# Patient Record
Sex: Male | Born: 1990 | Race: White | Hispanic: No
Health system: Southern US, Community
[De-identification: ages and names within clinical notes are randomized; demographics above are authoritative.]

---

## 2014-02-19 ENCOUNTER — Other Ambulatory Visit: Payer: Self-pay | Admitting: Neurological Surgery

## 2014-02-19 ENCOUNTER — Other Ambulatory Visit (HOSPITAL_BASED_OUTPATIENT_CLINIC_OR_DEPARTMENT_OTHER): Payer: Self-pay | Admitting: Neurological Surgery

## 2014-02-19 DIAGNOSIS — M542 Cervicalgia: Secondary | ICD-10-CM

## 2014-03-05 ENCOUNTER — Other Ambulatory Visit (HOSPITAL_BASED_OUTPATIENT_CLINIC_OR_DEPARTMENT_OTHER): Payer: Self-pay | Admitting: Neurological Surgery

## 2014-03-05 DIAGNOSIS — Z1389 Encounter for screening for other disorder: Secondary | ICD-10-CM

## 2014-03-06 ENCOUNTER — Ambulatory Visit (INDEPENDENT_AMBULATORY_CARE_PROVIDER_SITE_OTHER): Payer: Medicaid Other

## 2014-03-06 DIAGNOSIS — M5020 Other cervical disc displacement, unspecified cervical region: Secondary | ICD-10-CM

## 2014-03-06 DIAGNOSIS — Z1389 Encounter for screening for other disorder: Secondary | ICD-10-CM

## 2014-03-06 DIAGNOSIS — M542 Cervicalgia: Secondary | ICD-10-CM

## 2014-03-06 IMAGING — CR DG ORBITS FOR FOREIGN BODY
2 series · 2 of 2 positions shown · non-contrast
Comparison: None.

CLINICAL DATA: Metal working/exposure; clearance prior to MRI

EXAM:
ORBITS FOR FOREIGN BODY - 2 VIEW

[view not recorded (1 of 2)]
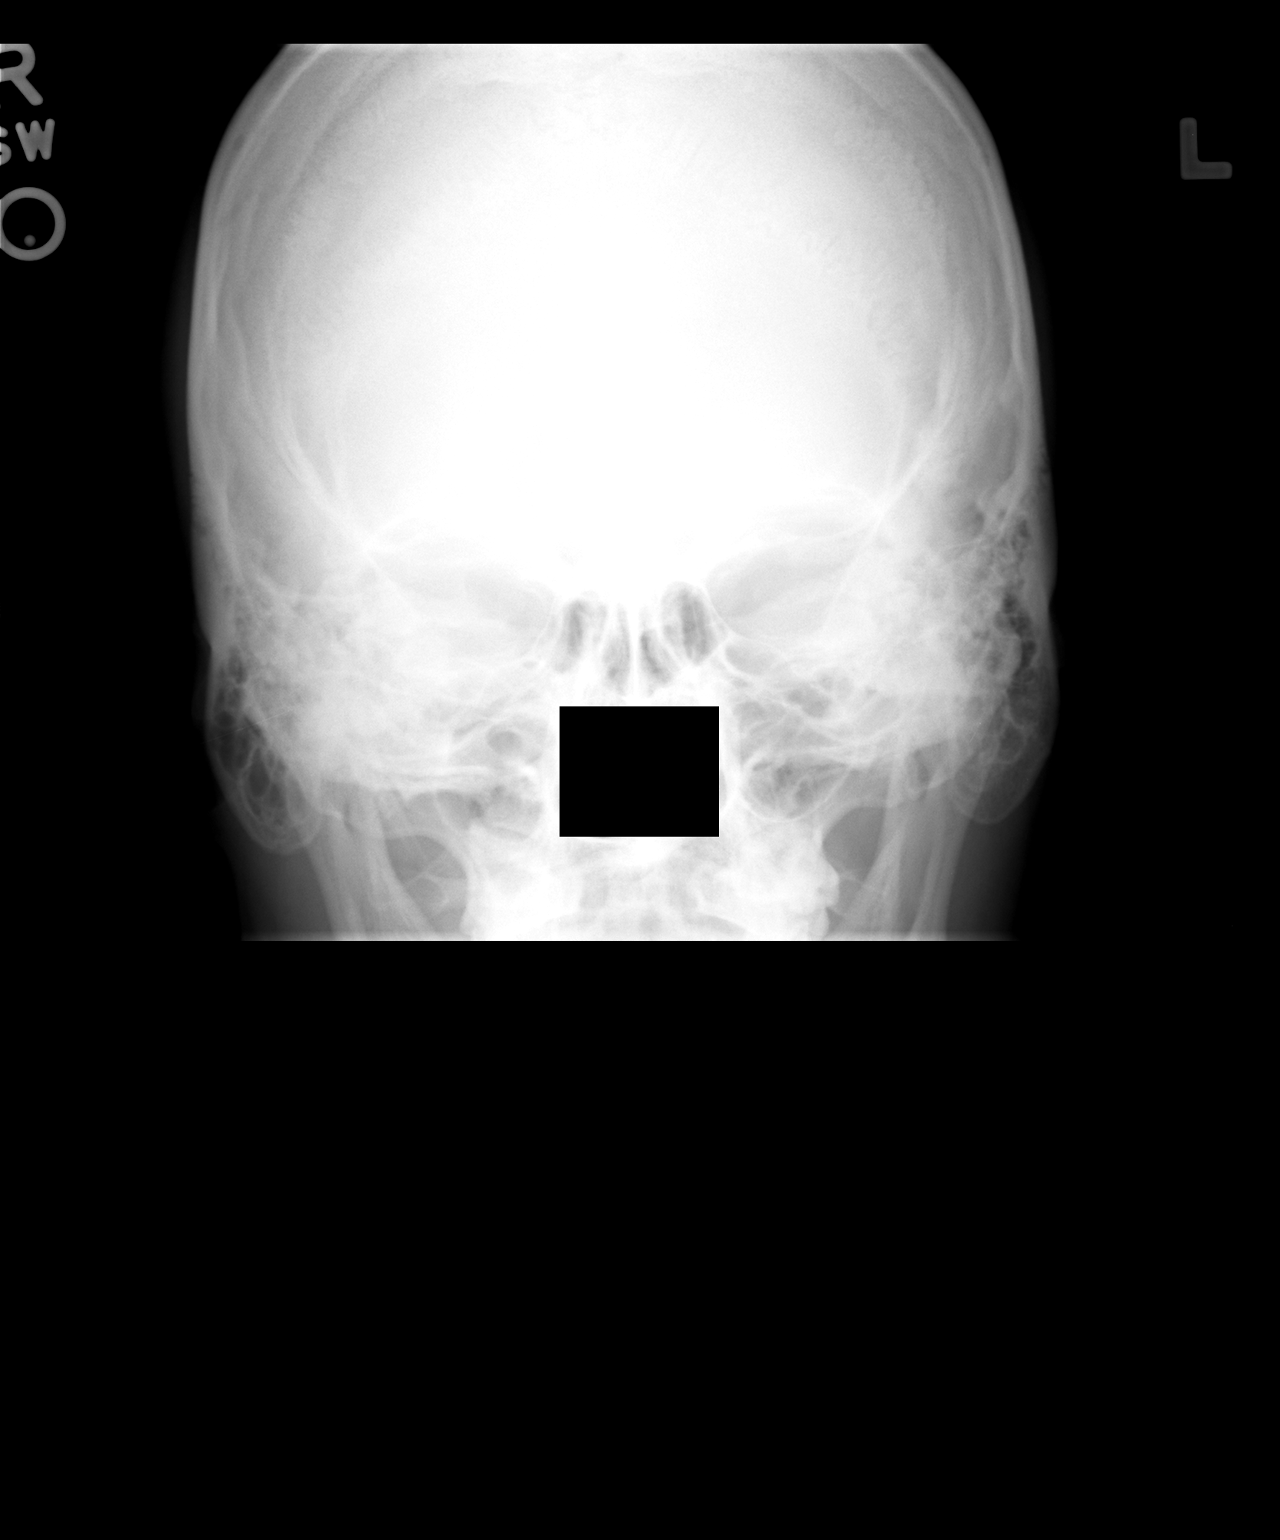

[view not recorded (2 of 2)]
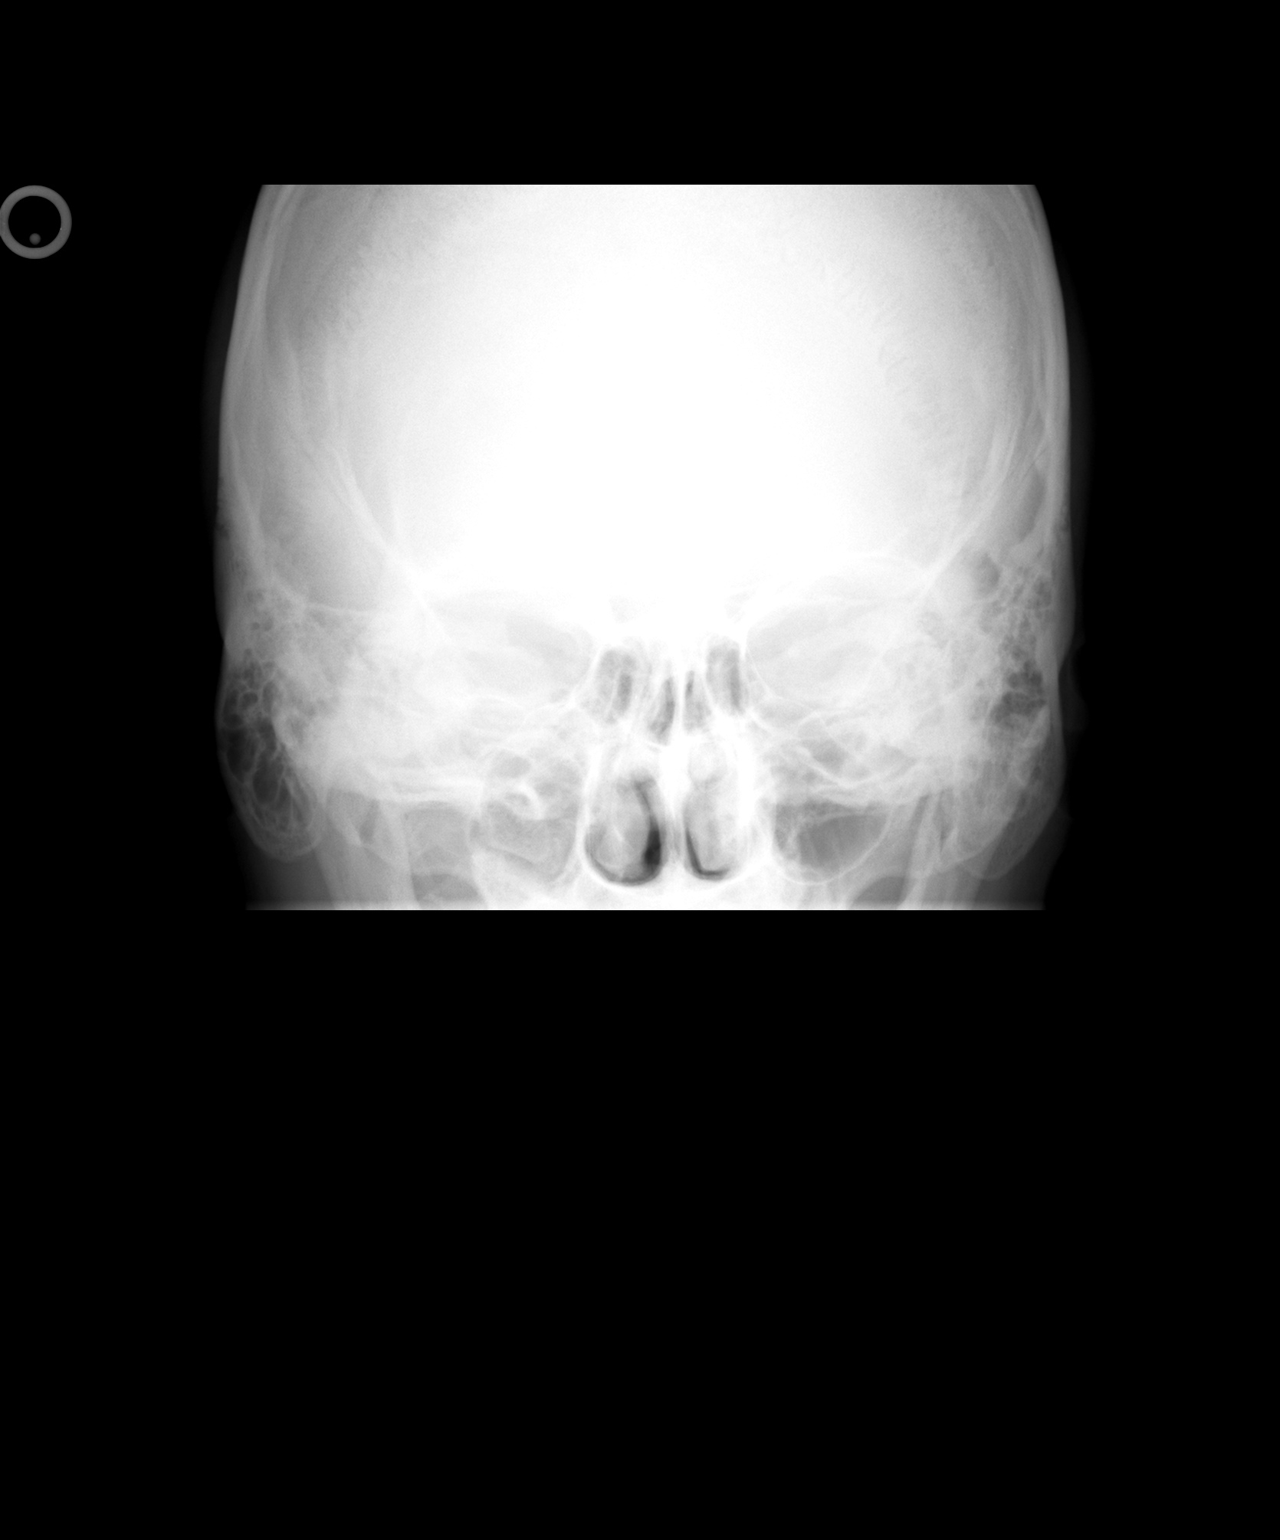

[2 of 2 positions shown; findings below may reference images not displayed]

FINDINGS: No radiopaque foreign body.  Ok to proceed to MRI.
IMPRESSION: No radiopaque foreign body.  Ok to proceed to MRI.
